# Patient Record
Sex: Male | Born: 1935 | Race: Black or African American | Hispanic: No | Smoking: Never smoker
Health system: Southern US, Community
[De-identification: ages and names within clinical notes are randomized; demographics above are authoritative.]

## PROBLEM LIST (undated history)

## (undated) DIAGNOSIS — M1990 Unspecified osteoarthritis, unspecified site: Secondary | ICD-10-CM

## (undated) DIAGNOSIS — I10 Essential (primary) hypertension: Secondary | ICD-10-CM

## (undated) DIAGNOSIS — I639 Cerebral infarction, unspecified: Secondary | ICD-10-CM

## (undated) DIAGNOSIS — K219 Gastro-esophageal reflux disease without esophagitis: Secondary | ICD-10-CM

## (undated) HISTORY — DX: Essential (primary) hypertension: I10

## (undated) HISTORY — DX: Gastro-esophageal reflux disease without esophagitis: K21.9

## (undated) HISTORY — DX: Unspecified osteoarthritis, unspecified site: M19.90

## (undated) HISTORY — PX: CATARACT EXTRACTION: SUR2

## (undated) HISTORY — DX: Cerebral infarction, unspecified: I63.9

---

## 2011-01-15 ENCOUNTER — Ambulatory Visit (HOSPITAL_COMMUNITY)
Admission: RE | Admit: 2011-01-15 | Discharge: 2011-01-15 | Payer: Self-pay | Source: Home / Self Care | Attending: Internal Medicine | Admitting: Internal Medicine

## 2011-01-16 LAB — POCT I-STAT, CHEM 8
Glucose, Bld: 120 mg/dL — ABNORMAL HIGH (ref 70–99)
HCT: 42 % (ref 39.0–52.0)
Hemoglobin: 14.3 g/dL (ref 13.0–17.0)
Potassium: 3.2 mEq/L — ABNORMAL LOW (ref 3.5–5.1)
Sodium: 134 mEq/L — ABNORMAL LOW (ref 135–145)

## 2011-01-16 LAB — BASIC METABOLIC PANEL
CO2: 28 mEq/L (ref 19–32)
Calcium: 9.3 mg/dL (ref 8.4–10.5)
Creatinine, Ser: 0.95 mg/dL (ref 0.4–1.5)
GFR calc Af Amer: >60 mL/min (ref >60)

## 2011-01-16 LAB — CBC
MCH: 29.9 pg (ref 26.0–34.0)
Platelets: 233 10*3/uL (ref 150–400)
RBC: 4.62 MIL/uL (ref 4.22–5.81)
WBC: 12.8 10*3/uL — ABNORMAL HIGH (ref 4.0–10.5)

## 2011-01-16 LAB — APTT: aPTT: 28 seconds (ref 24–37)

## 2011-01-18 ENCOUNTER — Other Ambulatory Visit (HOSPITAL_COMMUNITY): Payer: Self-pay | Admitting: Interventional Radiology

## 2011-01-18 DIAGNOSIS — R52 Pain, unspecified: Secondary | ICD-10-CM

## 2011-01-25 ENCOUNTER — Ambulatory Visit (HOSPITAL_COMMUNITY)
Admission: RE | Admit: 2011-01-25 | Discharge: 2011-01-25 | Disposition: A | Discharge status: Home / Self Care | Payer: Self-pay | Source: Ambulatory Visit | Attending: Interventional Radiology | Admitting: Interventional Radiology

## 2011-01-25 DIAGNOSIS — R52 Pain, unspecified: Secondary | ICD-10-CM

## 2012-06-30 IMAGING — XA IR ANGIO/VERTEBRAL*R*
1 series · 13 of 24 positions shown · IV contrast (IODINE)
Comparison: MRI of the brain and MRA of the brain from [facility] done recently.

CLINICAL DATA: Patient with cerebral hemisphere ischemic stroke.
Right-sided weakness improving.  Abnormal US of the carotids.

BILATERAL CAROTID ARTERIOGRAPHY AND BILATERAL VERTEBRAL ARTERY
ANGIOGRAMS

[Series 300: neuro · 13 of 232 slices shown]
[im 1/232]
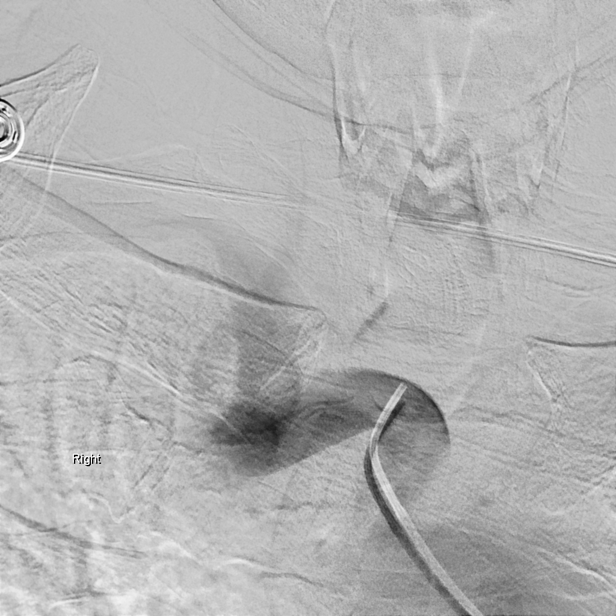
[im 21/232]
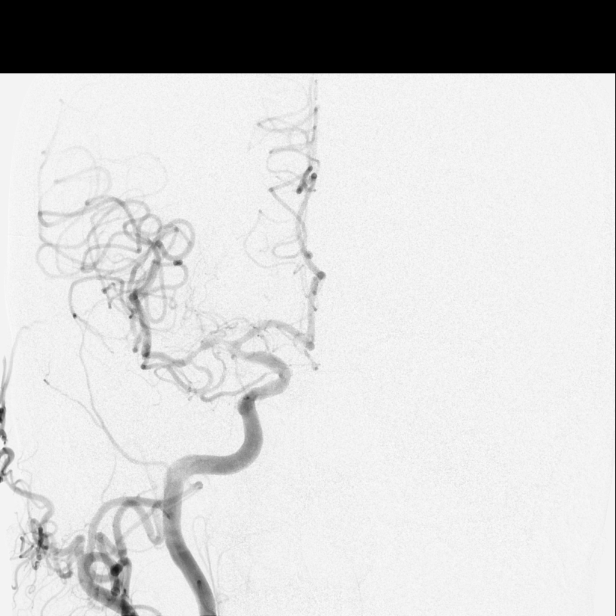
[im 41/232]
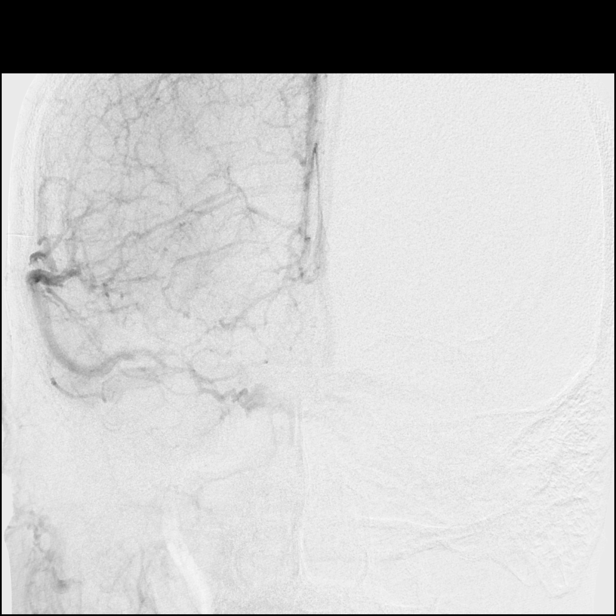
[im 61/232]
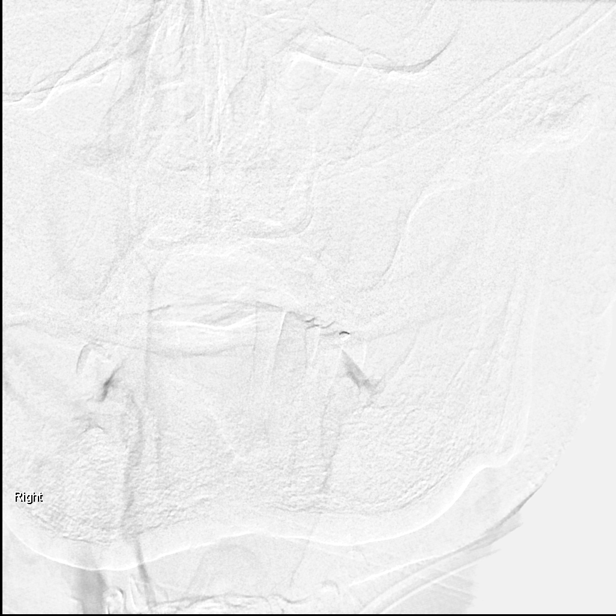
[im 81/232]
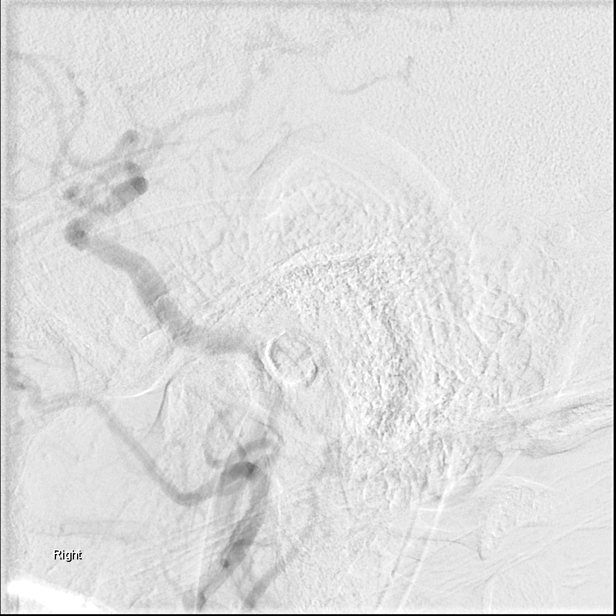
[im 101/232]
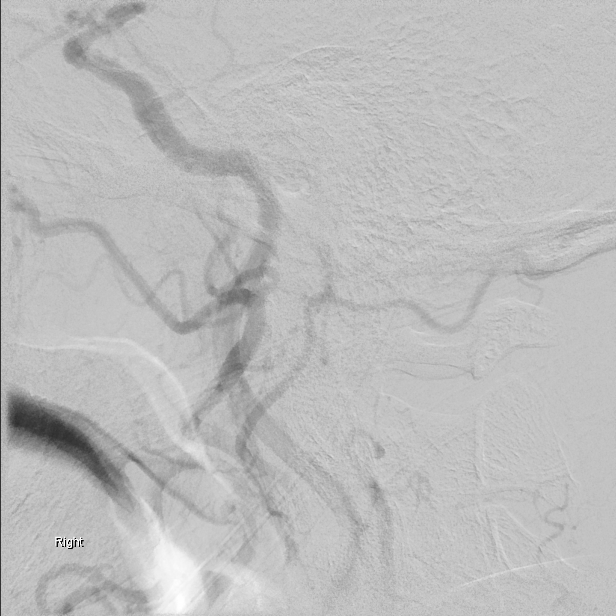
[im 121/232]
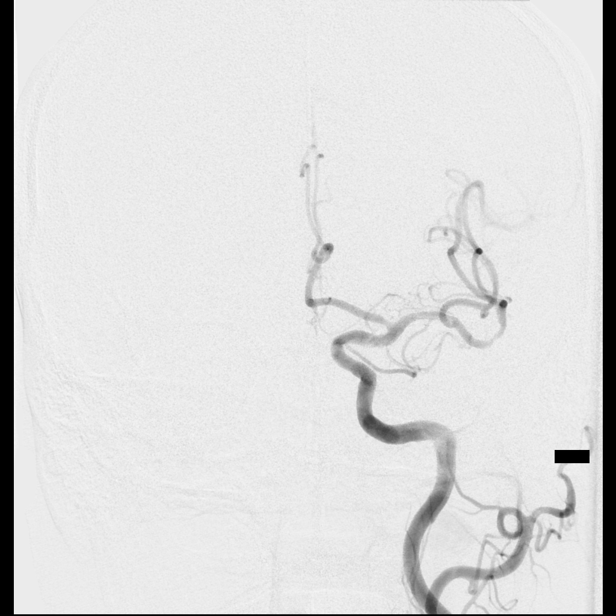
[im 131/232]
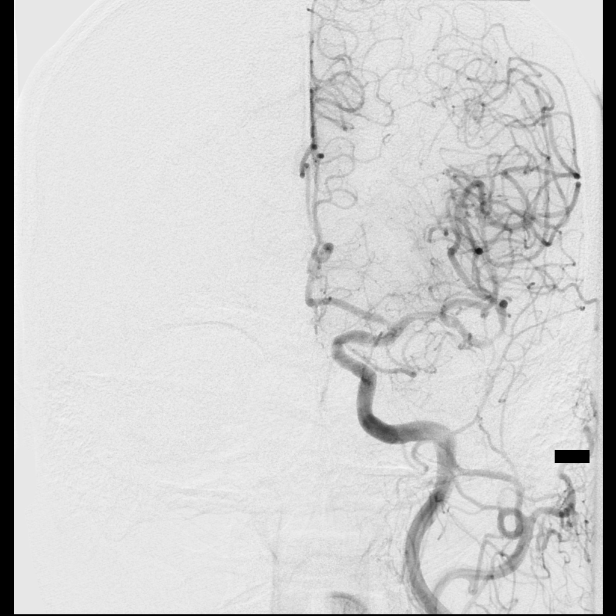
[im 151/232]
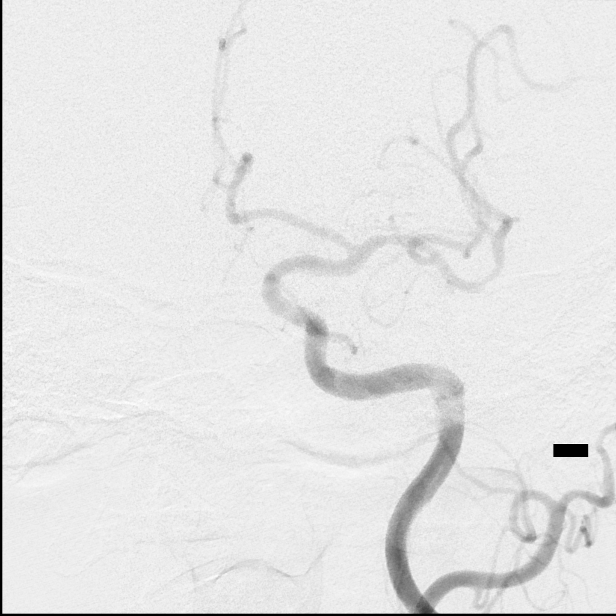
[im 171/232]
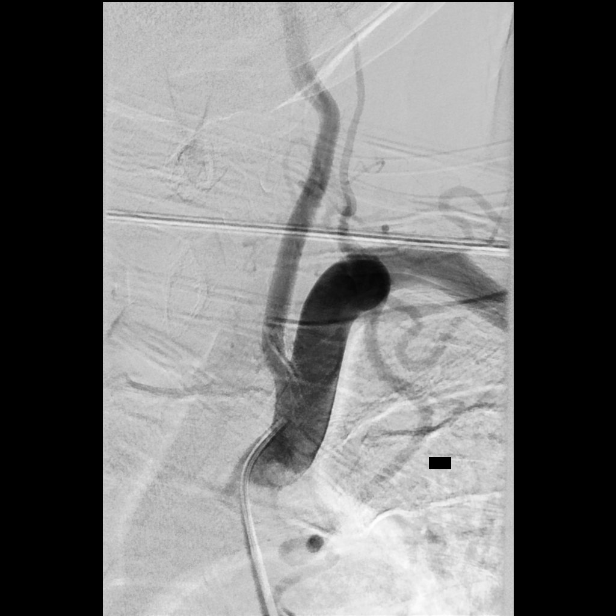
[im 191/232]
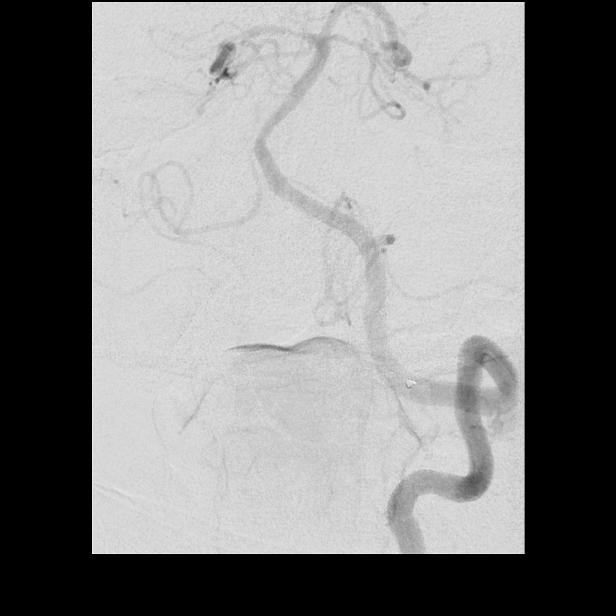
[im 211/232]
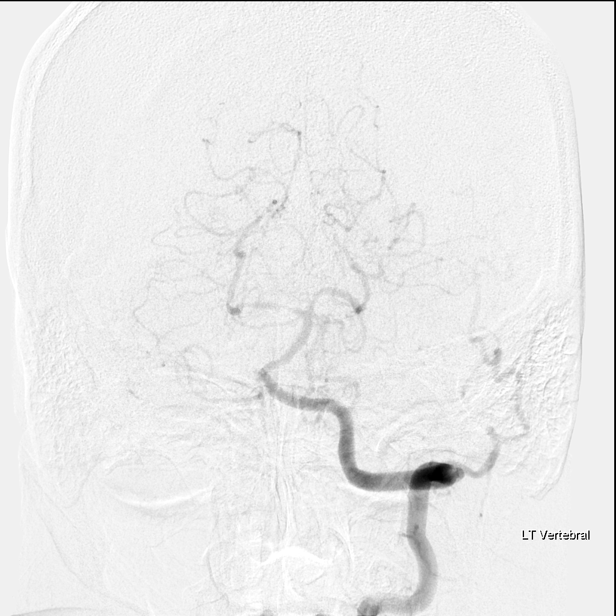
[im 232/232]
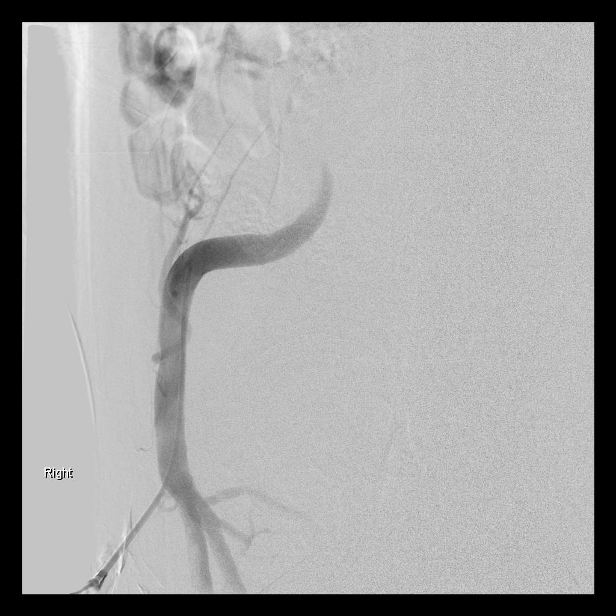

[13 of 24 positions shown; findings below may reference images not displayed]

Following a full explanation of the procedure along with the
potential associated complications, an informed witnessed consent
was obtained.

The right groin was prepped and draped in the usual sterile
fashion.  Thereafter using modified Seldinger technique,
transfemoral access into the right common femoral artery was
obtained without difficulty.  Over a 0.035-inch guidewire, a 5-
French Pinnacle sheath was inserted.  Through this and also over a
0.035-inch guidewire, a 5-French JB1 catheter was advanced to the
aortic arch region and selectively positioned in the right common
carotid artery, the right subclavian artery, the left common
carotid artery and the left subclavian artery.

There were no acute complications.  The patient tolerated the
procedure well.

Medications utilized: Versed 1.5 mg IV.  Fentanyl 37.5 mcg IV.

Contrast: Amnipaque-5OO approximately 65 ml.
FINDINGS: The right common carotid arteriogram demonstrates the
right external carotid artery and its major branches to be normal.

The right internal carotid artery at the bulb to the cranial skull
base is normal.

The petrous segment is widely patent.

There is mild circumferential narrowing of the proximal cavernous
segment secondary to atherosclerotic disease.

The supraclinoid segment is normal proximally. There is
approximately 50% stenosis of the supraclinoid segment just
proximal to the right internal carotid artery terminus.

The right middle cerebral artery has two focal areas of significant
stenosis of approximately 60-70% in the M1 segment.  The MCA
trifurcation branches however are widely patent.  The subsequent
capillary and venous phases are normal.

The right anterior cerebral artery is normally opacified into
capillary and venous phases.

The right vertebral artery origin is normal.  The vessel opacifies
normally to the cranial skull base where it terminates in the
ipsilateral right posterior inferior cerebellar artery.

Attenuated caliber of the right vertebrobasilar junction is noted.

The left common carotid arteriogram demonstrates the left external
carotid artery and its major branches to be normal.

There is minimal shallow smooth plaque along the posterior wall of
the left internal carotid artery at the bulb.  More distally the
vessel is seen to opacify normally to the cranial skull base

The petrous, cavernous and supraclinoid segments are normally
opacified.

The left middle cerebral artery in the M1 segment is normally
opacified.

The dominant inferior division of the left middle cerebral artery
demonstrates approximately 70% stenosis proximally.  More distally
the left MCA distribution and the left anterior cerebral artery are
seen to opacify normally into capillary and venous phases.

The left vertebral artery origin is normal.  The vessel opacifies
normally to the cranial skull base.  There is normal opacification
of the left posterior inferior cerebellar artery which shows focal
areas of arteriosclerotic narrowing in its proximal third.

The left vertebrobasilar junction is normal.

There is mild focal narrowing of the mid basilar artery just distal
to the origin of the anterior-inferior cerebellar artery.

The distal basilar artery is normal.

Both the superior cerebellar arteries are normally opacified into
the capillary and venous phases as are the anterior inferior
cerebellar arteries.

The right posterior cerebral artery proximally has a tight focal
significant stenosis.  The left posterior cerebral artery is
normally opacified proximally.  Both these have mild focal areas of
arteriosclerotic narrowing in their distal P3 regions.
IMPRESSION: 1.  60-70% tandem stenosis of the right middle cerebral artery M1
segment, and approximately 50% stenosis of the right internal
carotid artery supraclinoid segment.
2.  Tight focal stenosis of the proximal right PCA P1 segment.
3.  Approximately 70% stenosis of the dominant inferior division of
the left middle cerebral artery proximally.

## 2021-11-02 ENCOUNTER — Other Ambulatory Visit: Payer: Self-pay

## 2021-11-02 ENCOUNTER — Encounter: Payer: Self-pay | Admitting: Orthopaedic Surgery

## 2021-11-02 ENCOUNTER — Ambulatory Visit (INDEPENDENT_AMBULATORY_CARE_PROVIDER_SITE_OTHER): Payer: Medicare Other | Admitting: Orthopaedic Surgery

## 2021-11-02 VITALS — BP 140/86 | HR 126 | Ht 67.0 in | Wt 197.0 lb

## 2021-11-02 DIAGNOSIS — M25562 Pain in left knee: Secondary | ICD-10-CM

## 2021-11-02 DIAGNOSIS — M170 Bilateral primary osteoarthritis of knee: Secondary | ICD-10-CM | POA: Diagnosis not present

## 2021-11-02 DIAGNOSIS — M25561 Pain in right knee: Secondary | ICD-10-CM

## 2021-11-02 DIAGNOSIS — M2550 Pain in unspecified joint: Secondary | ICD-10-CM

## 2021-11-07 DIAGNOSIS — M170 Bilateral primary osteoarthritis of knee: Secondary | ICD-10-CM

## 2021-11-07 MED ORDER — METHYLPREDNISOLONE ACETATE 40 MG/ML IJ SUSP
40.0000 mg | INTRAMUSCULAR | Status: AC | PRN
  Administered 2021-11-07: 40 mg via INTRA_ARTICULAR

## 2021-11-07 MED ORDER — LIDOCAINE HCL 1 % IJ SOLN
0.5000 mL | INTRAMUSCULAR | Status: AC | PRN
  Administered 2021-11-07: .5 mL

## 2021-11-07 MED ORDER — BUPIVACAINE HCL 0.25 % IJ SOLN
4.0000 mL | INTRAMUSCULAR | Status: AC | PRN
  Administered 2021-11-07: 4 mL via INTRA_ARTICULAR

## 2021-11-07 NOTE — Progress Notes (Signed)
Office Visit Note   Patient: Karl Rios           Date of Birth: 07-May-1932           MRN: [id number] Visit Date: 11/02/2021              Requested by: Karl Burly, MD [place],  [place] [id number] PCP: Karl Burly, MD   Assessment & Plan: Visit Diagnoses:  1. Multiple joint pain   2. Bilateral primary osteoarthritis of knee     Plan: We injected his right knee today.  Radiographs show slightly worse knee arthritis on the left than right knee.  He does have chondrocalcinosis.  Will obtain a be met and uric acid.  Addendum: Uric acid was 5.4  BMET showed normal renal function.  Follow-Up Instructions: No follow-ups on file.   Orders:  Orders Placed This Encounter  Procedures   Large Joint Inj   Basic Metabolic Panel (BMET)   Uric acid   No orders of the defined types were placed in this encounter.     Procedures: Large Joint Inj: R knee on 11/07/2021 8:27 AM Indications: pain and joint swelling Details: 22 G 1.5 in needle, anterolateral approach  Arthrogram: No  Medications: 40 mg methylPREDNISolone acetate 40 MG/ML; 0.5 mL lidocaine 1 %; 4 mL bupivacaine 0.25 % Outcome: tolerated well, no immediate complications Procedure, treatment alternatives, risks and benefits explained, specific risks discussed. Consent was given by the patient. Immediately prior to procedure a time out was called to verify the correct patient, procedure, equipment, support staff and site/side marked as required. Patient was prepped and draped in the usual sterile fashion.      Clinical Data: No additional findings.   Subjective: Chief Complaint  Patient presents with   Right Knee - Pain   Left Knee - Pain    HPI 87 year old male who sees Karl Rios presents with bilateral knee pain worse on the right than left.  He has been using a walker and uses a wheelchair for long distance.  Difficulty bending his knee.  He has been on prednisone in the past also  colchicine and gabapentin.  No recent uric acid. Patient's had previous knee injections none recently.  Previous x-rays showed chondrocalcinosis with mild joint narrowing right worse than left knee.  Mild patellofemoral degenerative changes.  Patient states he does not have much energy more difficulty walking the last few weeks.  He does have hypertension and takes Plavix is used diclofenac gel on his knees with minimal improvement. Review of Systems all other systems noncontributory to HPI.  Non-smoker.   Objective: Vital Signs: BP 140/86   Pulse (!) 126   Ht $R'5\' 7"'dY$  (1.702 m)   Wt 197 lb (89.4 kg)   BMI 30.85 kg/m   Physical Exam Constitutional:      Appearance: He is well-developed.  HENT:     Head: Normocephalic and atraumatic.     Right Ear: External ear normal.     Left Ear: External ear normal.  Eyes:     Pupils: Pupils are equal, round, and reactive to light.  Neck:     Thyroid: No thyromegaly.     Trachea: No tracheal deviation.  Cardiovascular:     Rate and Rhythm: Normal rate.  Pulmonary:     Effort: Pulmonary effort is normal.     Breath sounds: No wheezing.  Abdominal:     General: Bowel sounds are normal.     Palpations:  Abdomen is soft.  Musculoskeletal:     Cervical back: Neck supple.  Skin:    General: Skin is warm and dry.     Capillary Refill: Capillary refill takes less than 2 seconds.  Neurological:     Mental Status: He is alert and oriented to person, place, and time.  Psychiatric:        Behavior: Behavior normal.        Thought Content: Thought content normal.        Judgment: Judgment normal.    Ortho Exam mild synovitis both knees crepitus with knee range of motion with tenderness on the right and left knee.  Does have palpable pedal pulse.  Specialty Comments:  No specialty comments available.  Imaging: No results found.   PMFS History: Patient Active Problem List   Diagnosis Date Noted   Bilateral primary osteoarthritis of knee  11/07/2021    Past Medical History:  Diagnosis Date   Acid reflux    Arthritis    Hypertension    Stroke [facility])     No family history on file.   Social History   Occupational History   Not on file  Tobacco Use   Smoking status: Never   Smokeless tobacco: Never  Substance and Sexual Activity   Alcohol use: Not Currently   Drug use: Not on file   Sexual activity: Not on file

## 2024-08-20 ENCOUNTER — Other Ambulatory Visit (HOSPITAL_COMMUNITY): Payer: Self-pay | Admitting: Radiology

## 2024-08-20 DIAGNOSIS — J449 Chronic obstructive pulmonary disease, unspecified: Secondary | ICD-10-CM
